# Patient Record
Sex: Male | Born: 2000 | Race: Asian | Hispanic: No | Marital: Single
Health system: Southern US, Community
[De-identification: ages and names within clinical notes are randomized; demographics above are authoritative.]

---
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
## 2022-12-28 ENCOUNTER — Ambulatory Visit
Admission: RE | Admit: 2022-12-28 | Discharge: 2022-12-28 | Disposition: A | Discharge status: Home / Self Care | Payer: Managed Care, Other (non HMO) | Attending: Pediatrics | Admitting: Pediatrics

## 2022-12-28 ENCOUNTER — Ambulatory Visit
Admission: RE | Admit: 2022-12-28 | Discharge: 2022-12-28 | Disposition: A | Discharge status: Home / Self Care | Payer: Managed Care, Other (non HMO) | Source: Ambulatory Visit | Attending: Pediatrics | Admitting: Pediatrics

## 2022-12-28 ENCOUNTER — Other Ambulatory Visit: Payer: Self-pay | Admitting: Pediatrics

## 2022-12-28 DIAGNOSIS — R079 Chest pain, unspecified: Secondary | ICD-10-CM
